# Patient Record
Sex: Male | Born: 1965 | Race: White | Hispanic: No | Marital: Single | Smoking: Never smoker
Health system: Southern US, Community
[De-identification: ages and names within clinical notes are randomized; demographics above are authoritative.]

## PROBLEM LIST (undated history)

## (undated) HISTORY — PX: BACK SURGERY: SHX140

---

## 1999-04-05 ENCOUNTER — Emergency Department (HOSPITAL_COMMUNITY): Admission: EM | Admit: 1999-04-05 | Discharge: 1999-04-05 | Payer: Self-pay | Admitting: Emergency Medicine

## 1999-04-05 ENCOUNTER — Encounter: Payer: Self-pay | Admitting: Emergency Medicine

## 1999-06-01 ENCOUNTER — Emergency Department (HOSPITAL_COMMUNITY): Admission: EM | Admit: 1999-06-01 | Discharge: 1999-06-01 | Payer: Self-pay | Admitting: Emergency Medicine

## 2005-01-17 ENCOUNTER — Emergency Department (HOSPITAL_COMMUNITY): Admission: EM | Admit: 2005-01-17 | Discharge: 2005-01-17 | Payer: Self-pay | Admitting: Emergency Medicine

## 2005-08-09 ENCOUNTER — Emergency Department (HOSPITAL_COMMUNITY): Admission: EM | Admit: 2005-08-09 | Discharge: 2005-08-09 | Payer: Self-pay | Admitting: Emergency Medicine

## 2009-08-10 ENCOUNTER — Emergency Department (HOSPITAL_COMMUNITY): Admission: EM | Admit: 2009-08-10 | Discharge: 2009-08-10 | Payer: Self-pay | Admitting: Emergency Medicine

## 2010-01-03 ENCOUNTER — Emergency Department (HOSPITAL_BASED_OUTPATIENT_CLINIC_OR_DEPARTMENT_OTHER): Admission: EM | Admit: 2010-01-03 | Discharge: 2010-01-04 | Payer: Self-pay | Admitting: Emergency Medicine

## 2015-01-02 ENCOUNTER — Encounter: Payer: Self-pay | Admitting: Internal Medicine

## 2015-01-02 ENCOUNTER — Ambulatory Visit: Payer: Self-pay | Attending: Internal Medicine | Admitting: Internal Medicine

## 2015-01-02 VITALS — BP 144/89 | HR 103 | Temp 98.0°F | Resp 16 | Ht 72.0 in | Wt 250.0 lb

## 2015-01-02 DIAGNOSIS — R030 Elevated blood-pressure reading, without diagnosis of hypertension: Secondary | ICD-10-CM

## 2015-01-02 DIAGNOSIS — I10 Essential (primary) hypertension: Secondary | ICD-10-CM | POA: Insufficient documentation

## 2015-01-02 DIAGNOSIS — M5441 Lumbago with sciatica, right side: Secondary | ICD-10-CM | POA: Insufficient documentation

## 2015-01-02 DIAGNOSIS — IMO0001 Reserved for inherently not codable concepts without codable children: Secondary | ICD-10-CM

## 2015-01-02 MED ORDER — GABAPENTIN 300 MG PO CAPS: 300.0000 mg | ORAL_CAPSULE | Freq: Two times a day (BID) | ORAL | Status: AC

## 2015-01-02 MED ORDER — CYCLOBENZAPRINE HCL 10 MG PO TABS: 10.0000 mg | ORAL_TABLET | Freq: Three times a day (TID) | ORAL | Status: AC | PRN

## 2015-01-02 MED ORDER — ACETAMINOPHEN-CODEINE #3 300-30 MG PO TABS: 1.0000 | ORAL_TABLET | Freq: Three times a day (TID) | ORAL | Status: AC | PRN

## 2015-01-02 NOTE — Patient Instructions (Signed)
Sciatica Sciatica is pain, weakness, numbness, or tingling along the path of the sciatic nerve. The nerve starts in the lower back and runs down the back of each leg. The nerve controls the muscles in the lower leg and in the back of the knee, while also providing sensation to the back of the thigh, lower leg, and the sole of your foot. Sciatica is a symptom of another medical condition. For instance, nerve damage or certain conditions, such as a herniated disk or bone spur on the spine, pinch or put pressure on the sciatic nerve. This causes the pain, weakness, or other sensations normally associated with sciatica. Generally, sciatica only affects one side of the body. CAUSES   Herniated or slipped disc.  Degenerative disk disease.  A pain disorder involving the narrow muscle in the buttocks (piriformis syndrome).  Pelvic injury or fracture.  Pregnancy.  Tumor (rare). SYMPTOMS  Symptoms can vary from mild to very severe. The symptoms usually travel from the low back to the buttocks and down the back of the leg. Symptoms can include:  Mild tingling or dull aches in the lower back, leg, or hip.  Numbness in the back of the calf or sole of the foot.  Burning sensations in the lower back, leg, or hip.  Sharp pains in the lower back, leg, or hip.  Leg weakness.  Severe back pain inhibiting movement. These symptoms may get worse with coughing, sneezing, laughing, or prolonged sitting or standing. Also, being overweight may worsen symptoms. DIAGNOSIS  Your caregiver will perform a physical exam to look for common symptoms of sciatica. He or she may ask you to do certain movements or activities that would trigger sciatic nerve pain. Other tests may be performed to find the cause of the sciatica. These may include:  Blood tests.  X-rays.  Imaging tests, such as an MRI or CT scan. TREATMENT  Treatment is directed at the cause of the sciatic pain. Sometimes, treatment is not necessary  and the pain and discomfort goes away on its own. If treatment is needed, your caregiver may suggest:  Over-the-counter medicines to relieve pain.  Prescription medicines, such as anti-inflammatory medicine, muscle relaxants, or narcotics.  Applying heat or ice to the painful area.  Steroid injections to lessen pain, irritation, and inflammation around the nerve.  Reducing activity during periods of pain.  Exercising and stretching to strengthen your abdomen and improve flexibility of your spine. Your caregiver may suggest losing weight if the extra weight makes the back pain worse.  Physical therapy.  Surgery to eliminate what is pressing or pinching the nerve, such as a bone spur or part of a herniated disk. HOME CARE INSTRUCTIONS   Only take over-the-counter or prescription medicines for pain or discomfort as directed by your caregiver.  Apply ice to the affected area for 20 minutes, 3-4 times a day for the first 48-72 hours. Then try heat in the same way.  Exercise, stretch, or perform your usual activities if these do not aggravate your pain.  Attend physical therapy sessions as directed by your caregiver.  Keep all follow-up appointments as directed by your caregiver.  Do not wear high heels or shoes that do not provide proper support.  Check your mattress to see if it is too soft. A firm mattress may lessen your pain and discomfort. SEEK IMMEDIATE MEDICAL CARE IF:   You lose control of your bowel or bladder (incontinence).  You have increasing weakness in the lower back, pelvis, buttocks,   or legs.  You have redness or swelling of your back.  You have a burning sensation when you urinate.  You have pain that gets worse when you lie down or awakens you at night.  Your pain is worse than you have experienced in the past.  Your pain is lasting longer than 4 weeks.  You are suddenly losing weight without reason. MAKE SURE YOU:  Understand these  instructions.  Will watch your condition.  Will get help right away if you are not doing well or get worse. Document Released: 10/27/2001 Document Revised: 05/03/2012 Document Reviewed: 03/13/2012 [employer] Patient Information 2015 [employer]. This information is not intended to replace advice given to you by your health care provider. Make sure you discuss any questions you have with your health care provider.  

## 2015-01-02 NOTE — Progress Notes (Signed)
Pt is here to establish care. Pt has had 3 surgery's on his lower back. Pt states that when it gets cold his back pain flares up.

## 2015-01-02 NOTE — Progress Notes (Signed)
Patient ID: Mark Craig, male   DOB: 07/25/1966, 49 y.o.   MRN: [id number]  [id number]  [id number]  DOB - 09/16/1966  CC:  Chief Complaint  Patient presents with  . Follow-up       HPI: Mark Moodyodd Evelyn is a 49 y.o. male here today to establish medical care. Patient has no past medical history. He reports that he has had 3 surgeries on his back in the past including a laminectomy and 2 ruptured disc repair since 1987. He reports that has been using ibuprofen and tylenol for pain but states his stomach is bothering him. Orthopedics placed him on Neurotin, muscle relaxants, and pain medication.  The pain is located in his lower back and feels like his sciatic nerve is flaring up causing a tingling sensation in his left leg. He states that it is worse when he gets off work in the evenings and when the weather is cold. He states that he works daily and has no interest in applying for disability. He denies bowel/bladder dysfunction.   Patient has No headache, No chest pain, No abdominal pain - No Nausea, No Cough - SOB.  No Known Allergies History reviewed. No pertinent past medical history. No current outpatient prescriptions on file prior to visit.   No current facility-administered medications on file prior to visit.   History reviewed. No pertinent family history. History   Social History  . Marital Status: Single    Spouse Name: N/A  . Number of Children: N/A  . Years of Education: N/A   Occupational History  . Not on file.   Social History Main Topics  . Smoking status: Never Smoker   . Smokeless tobacco: Not on file  . Alcohol Use: No  . Drug Use: No  . Sexual Activity: Yes   Other Topics Concern  . Not on file   Social History Narrative  . No narrative on file    Review of Systems: See HPI  Objective:   Filed Vitals:   01/02/15 1118  BP: 145/84  Pulse: 103  Temp: 98 F (36.7 C)  Resp: 16    Physical Exam  Constitutional: He is oriented to person,  place, and time.  Cardiovascular: Normal rate, regular rhythm and normal heart sounds.   Pulmonary/Chest: Effort normal and breath sounds normal.  Musculoskeletal: He exhibits no edema or tenderness.  + straight leg raise (left)   Neurological: He is alert and oriented to person, place, and time.  Skin: Skin is warm and dry.  Psychiatric: He has a normal mood and affect.     No results found for: WBC, HGB, HCT, MCV, PLT No results found for: CREATININE, BUN, NA, K, CL, CO2  No results found for: HGBA1C Lipid Panel  No results found for: CHOL, TRIG, HDL, CHOLHDL, VLDL, LDLCALC     Assessment and plan:   Mark Craig was seen today for follow-up.  Diagnoses and all orders for this visit:  Midline low back pain with right-sided sciatica Orders: -     gabapentin (NEURONTIN) 300 MG capsule; Take 1 capsule (300 mg total) by mouth 2 (two) times daily. -     cyclobenzaprine (FLEXERIL) 10 MG tablet; Take 1 tablet (10 mg total) by mouth 3 (three) times daily as needed for muscle spasms. -     acetaminophen-codeine (TYLENOL #3) 300-30 MG per tablet; Take 1 tablet by mouth every 8 (eight) hours as needed for moderate pain. Explained signs and symptoms that should warrant immediate attention.  Patient verbalized  understanding with teach back used.  Elevated BP Will continue to monitor on each exam.   Return for call in 2 weeks for pain management referral, once patient gets his records transferred here from previous Ortho doctor for review. Patient is willing to pay out of pocket for pain management appointments.     Holland Commons, NP-C [facility] [phone] 01/02/2015, 11:47 AM

## 2015-01-16 ENCOUNTER — Telehealth: Payer: Self-pay | Admitting: Internal Medicine

## 2015-01-16 NOTE — Telephone Encounter (Signed)
Patient came into the clinic this afternoon to inquire about a referral that he asked for a few weeks ago; patient was very upset about referral not being placed and asked for a nurse or doctor to give him a call because he just recently had surgery; please f/u with patient about the status of his referral

## 2015-01-17 NOTE — Telephone Encounter (Signed)
Please read the bottom of my last office visit and call patient back. Please explain that him and I spoke about him getting records sent to office first so I may send them off with the pain management referral. I have not received any records. If he is that adamant about the referral you may place but advise him that he may be denied if there is nothing to review for them to figure out what they are treating.

## 2015-01-17 NOTE — Telephone Encounter (Signed)
Give information to pt, pt stated medical record  form was sigh. Advised to call previews provider to send records   Stated will come in to speak with supervisor

## 2015-01-21 ENCOUNTER — Ambulatory Visit: Payer: Self-pay

## 2015-05-10 ENCOUNTER — Emergency Department (HOSPITAL_COMMUNITY)
Admission: EM | Admit: 2015-05-10 | Discharge: 2015-05-10 | Disposition: A | Discharge status: Home / Self Care | Attending: Emergency Medicine | Admitting: Emergency Medicine

## 2015-05-10 ENCOUNTER — Emergency Department (HOSPITAL_COMMUNITY)

## 2015-05-10 ENCOUNTER — Encounter (HOSPITAL_COMMUNITY): Payer: Self-pay | Admitting: Emergency Medicine

## 2015-05-10 DIAGNOSIS — F1123 Opioid dependence with withdrawal: Secondary | ICD-10-CM | POA: Diagnosis not present

## 2015-05-10 DIAGNOSIS — Z79899 Other long term (current) drug therapy: Secondary | ICD-10-CM | POA: Insufficient documentation

## 2015-05-10 DIAGNOSIS — F1193 Opioid use, unspecified with withdrawal: Secondary | ICD-10-CM

## 2015-05-10 DIAGNOSIS — R109 Unspecified abdominal pain: Secondary | ICD-10-CM | POA: Insufficient documentation

## 2015-05-10 DIAGNOSIS — R112 Nausea with vomiting, unspecified: Secondary | ICD-10-CM | POA: Insufficient documentation

## 2015-05-10 DIAGNOSIS — R1110 Vomiting, unspecified: Secondary | ICD-10-CM | POA: Diagnosis present

## 2015-05-10 LAB — CBC WITH DIFFERENTIAL/PLATELET
BASOS ABS: 0 10*3/uL (ref 0.0–0.1)
BASOS PCT: 0 % (ref 0–1)
EOS ABS: 0 10*3/uL (ref 0.0–0.7)
Eosinophils Relative: 0 % (ref 0–5)
HCT: 43.2 % (ref 39.0–52.0)
Hemoglobin: 14.7 g/dL (ref 13.0–17.0)
Lymphocytes Relative: 18 % (ref 12–46)
Lymphs Abs: 2.1 10*3/uL (ref 0.7–4.0)
MCH: 29.9 pg (ref 26.0–34.0)
MCHC: 34 g/dL (ref 30.0–36.0)
MCV: 87.8 fL (ref 78.0–100.0)
MONO ABS: 1 10*3/uL (ref 0.1–1.0)
Monocytes Relative: 8 % (ref 3–12)
NEUTROS ABS: 8.6 10*3/uL — AB (ref 1.7–7.7)
Neutrophils Relative %: 74 % (ref 43–77)
Platelets: 264 10*3/uL (ref 150–400)
RBC: 4.92 MIL/uL (ref 4.22–5.81)
RDW: 12.5 % (ref 11.5–15.5)
WBC: 11.7 10*3/uL — ABNORMAL HIGH (ref 4.0–10.5)

## 2015-05-10 LAB — URINALYSIS, ROUTINE W REFLEX MICROSCOPIC
Bilirubin Urine: NEGATIVE
Glucose, UA: NEGATIVE mg/dL
Hgb urine dipstick: NEGATIVE
KETONES UR: NEGATIVE mg/dL
LEUKOCYTES UA: NEGATIVE
Nitrite: NEGATIVE
PH: 6 (ref 5.0–8.0)
PROTEIN: NEGATIVE mg/dL
SPECIFIC GRAVITY, URINE: 1.02 (ref 1.005–1.030)
Urobilinogen, UA: 0.2 mg/dL (ref 0.0–1.0)

## 2015-05-10 LAB — COMPREHENSIVE METABOLIC PANEL
ALT: 19 U/L (ref 17–63)
AST: 16 U/L (ref 15–41)
Albumin: 3.8 g/dL (ref 3.5–5.0)
Alkaline Phosphatase: 61 U/L (ref 38–126)
Anion gap: 8 (ref 5–15)
BUN: 13 mg/dL (ref 6–20)
CALCIUM: 8.4 mg/dL — AB (ref 8.9–10.3)
CHLORIDE: 106 mmol/L (ref 101–111)
CO2: 24 mmol/L (ref 22–32)
Creatinine, Ser: 1.04 mg/dL (ref 0.61–1.24)
GFR calc Af Amer: >60 mL/min (ref >60)
GFR calc non Af Amer: >60 mL/min (ref >60)
Glucose, Bld: 99 mg/dL (ref 65–99)
Potassium: 4 mmol/L (ref 3.5–5.1)
SODIUM: 138 mmol/L (ref 135–145)
Total Bilirubin: 1.3 mg/dL — ABNORMAL HIGH (ref 0.3–1.2)
Total Protein: 6.9 g/dL (ref 6.5–8.1)

## 2015-05-10 MED ORDER — SODIUM CHLORIDE 0.9 % IV BOLUS (SEPSIS)
1000.0000 mL | Freq: Once | INTRAVENOUS | Status: AC
  Administered 2015-05-10: 1000 mL via INTRAVENOUS

## 2015-05-10 MED ORDER — ONDANSETRON HCL 4 MG/2ML IJ SOLN
4.0000 mg | Freq: Once | INTRAMUSCULAR | Status: AC
  Administered 2015-05-10: 4 mg via INTRAVENOUS
  Filled 2015-05-10: qty 2

## 2015-05-10 MED ORDER — GI COCKTAIL ~~LOC~~
ORAL | Status: AC
  Filled 2015-05-10: qty 30

## 2015-05-10 MED ORDER — KETOROLAC TROMETHAMINE 30 MG/ML IJ SOLN
30.0000 mg | Freq: Once | INTRAMUSCULAR | Status: AC
  Administered 2015-05-10: 30 mg via INTRAVENOUS
  Filled 2015-05-10: qty 1

## 2015-05-10 MED ORDER — GI COCKTAIL ~~LOC~~
30.0000 mL | Freq: Once | ORAL | Status: AC
  Administered 2015-05-10: 30 mL via ORAL

## 2015-05-10 MED ORDER — ONDANSETRON 8 MG PO TBDP: 8.0000 mg | ORAL_TABLET | Freq: Three times a day (TID) | ORAL | Status: AC | PRN

## 2015-05-10 NOTE — ED Provider Notes (Signed)
CSN: [id number]     Arrival date & time 05/10/15  0354 History   First MD Initiated Contact with Patient 05/10/15 0358     Chief Complaint  Patient presents with  . Emesis      HPI Patient with nausea and vomiting over the past 48 hours.  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  He's been incarcerated for the past 4 days.  His had no Roxicodone since his incarceration.  He reports left-sided flank and left-sided abdominal pain with associated nausea vomiting.  Reports urinary frequency without dysuria.  No prior history kidney stones.  No fevers or chills.  No hematemesis.  His pain is moderate in severity.   History reviewed. No pertinent past medical history. Past Surgical History  Procedure Laterality Date  . Back surgery     History reviewed. No pertinent family history. History  Substance Use Topics  . Smoking status: Never Smoker   . Smokeless tobacco: Not on file  . Alcohol Use: No    Review of Systems  All other systems reviewed and are negative.     Allergies  Review of patient's allergies indicates no known allergies.  Home Medications   Prior to Admission medications   Medication Sig Start Date End Date Taking? Authorizing Provider  acetaminophen-codeine (TYLENOL #3) 300-30 MG per tablet Take 1 tablet by mouth every 8 (eight) hours as needed for moderate pain. 01/02/15  Yes Ambrose Finland, NP  cyclobenzaprine (FLEXERIL) 10 MG tablet Take 1 tablet (10 mg total) by mouth 3 (three) times daily as needed for muscle spasms. 01/02/15  Yes Ambrose Finland, NP  gabapentin (NEURONTIN) 300 MG capsule Take 1 capsule (300 mg total) by mouth 2 (two) times daily. 01/02/15  Yes Ambrose Finland, NP  ibuprofen (ADVIL,MOTRIN) 800 MG tablet Take 800 mg by mouth every 8 (eight) hours as needed.    Historical Provider, MD  ondansetron (ZOFRAN ODT) 8 MG disintegrating tablet Take 1 tablet (8 mg total) by mouth every 8 (eight) hours as needed for nausea or vomiting. 05/10/15   Azalia Bilis, MD   BP 136/85 mmHg  Pulse 78  Temp(Src) 98.1 F (36.7 C) (Oral)  Resp 20  Ht 6' (1.829 m)  Wt 220 lb (99.791 kg)  BMI 29.83 kg/m2  SpO2 97% Physical Exam  Constitutional: He is oriented to person, place, and time. He appears well-developed and well-nourished.  HENT:  Head: Normocephalic and atraumatic.  Eyes: EOM are normal.  Neck: Normal range of motion.  Cardiovascular: Normal rate, regular rhythm, normal heart sounds and intact distal pulses.   Pulmonary/Chest: Effort normal and breath sounds normal. No respiratory distress.  Abdominal: Soft. He exhibits no distension. There is no tenderness.  Musculoskeletal: Normal range of motion.  Neurological: He is alert and oriented to person, place, and time.  Skin: Skin is warm and dry.  Psychiatric: He has a normal mood and affect. Judgment normal.  Nursing note and vitals reviewed.   ED Course  Procedures (including critical care time) Labs Review Labs Reviewed  CBC WITH DIFFERENTIAL/PLATELET - Abnormal; Notable for the following:    WBC 11.7 (*)    Neutro Abs 8.6 (*)    All other components within normal limits  COMPREHENSIVE METABOLIC PANEL - Abnormal; Notable for the following:    Calcium 8.4 (*)    Total Bilirubin 1.3 (*)    All other components within normal limits  URINALYSIS, ROUTINE W REFLEX MICROSCOPIC (NOT AT [facility])    Imaging  Review Ct Renal Stone Study  05/10/2015   CLINICAL DATA:  Acute onset of generalized abdominal pain, nausea and vomiting. Left flank pain. Initial encounter.  EXAM: CT ABDOMEN AND PELVIS WITHOUT CONTRAST  TECHNIQUE: Multidetector CT imaging of the abdomen and pelvis was performed following the standard protocol without IV contrast.  COMPARISON:  None.  FINDINGS: The visualized lung bases are clear.  The liver and spleen are unremarkable in appearance. The gallbladder is within normal limits. The pancreas and adrenal glands are unremarkable.  Mild nonspecific perinephric stranding is  noted bilaterally. The kidneys are otherwise unremarkable. There is no evidence of hydronephrosis. No renal or ureteral stones are seen.  No free fluid is identified. The small bowel is unremarkable in appearance. The stomach is within normal limits. No acute vascular abnormalities are seen. Minimal calcification is seen along the abdominal aorta.  The appendix is normal in caliber, without evidence of appendicitis. The colon is unremarkable in appearance.  The bladder is mildly distended and grossly unremarkable. The prostate remains normal in size. No inguinal lymphadenopathy is seen.  Small bilateral inguinal hernias are seen, containing only fat.  No acute osseous abnormalities are identified. Vacuum phenomenon, disc space narrowing and mild endplate sclerosis are seen L4-L5.  IMPRESSION: 1. No acute abnormality seen within the abdomen or pelvis. 2. Small bilateral inguinal hernias, containing only fat.   Electronically Signed   By: Roanna Raider M.D.   On: 05/10/2015 04:42  I personally reviewed the imaging tests through PACS system I reviewed available ER/hospitalization records through the EMR    EKG Interpretation None      MDM   Final diagnoses:  Abdominal pain, unspecified abdominal location  Opioid withdrawal    Hydration emergency department.  Labs, urine, CT scan without acute abnormality.  Discharge home in good condition.  Home with Zofran.  I suspect the majority of this is a opioid withdrawal.  Patient is able to keep down fluids in the ER    Azalia Bilis, MD 05/10/15 [id number]

## 2015-05-10 NOTE — Discharge Instructions (Signed)
Abdominal Pain Many things can cause abdominal pain. Usually, abdominal pain is not caused by a disease and will improve without treatment. It can often be observed and treated at home. Your health care provider will do a physical exam and possibly order blood tests and X-rays to help determine the seriousness of your pain. However, in many cases, more time must pass before a clear cause of the pain can be found. Before that point, your health care provider may not know if you need more testing or further treatment. HOME CARE INSTRUCTIONS  Monitor your abdominal pain for any changes. The following actions may help to alleviate any discomfort you are experiencing:  Only take over-the-counter or prescription medicines as directed by your health care provider.  Do not take laxatives unless directed to do so by your health care provider.  Try a clear liquid diet (broth, tea, or water) as directed by your health care provider. Slowly move to a bland diet as tolerated. SEEK MEDICAL CARE IF:  You have unexplained abdominal pain.  You have abdominal pain associated with nausea or diarrhea.  You have pain when you urinate or have a bowel movement.  You experience abdominal pain that wakes you in the night.  You have abdominal pain that is worsened or improved by eating food.  You have abdominal pain that is worsened with eating fatty foods.  You have a fever. SEEK IMMEDIATE MEDICAL CARE IF:   Your pain does not go away within 2 hours.  You keep throwing up (vomiting).  Your pain is felt only in portions of the abdomen, such as the right side or the left lower portion of the abdomen.  You pass bloody or black tarry stools. MAKE SURE YOU:  Understand these instructions.   Will watch your condition.   Will get help right away if you are not doing well or get worse.  Document Released: 08/12/2005 Document Revised: 11/07/2013 Document Reviewed: 07/12/2013 [facility] Patient Information  2015 [place], [place]. This information is not intended to replace advice given to you by your health care provider. Make sure you discuss any questions you have with your health care provider.  Opioid Withdrawal Opioids are a group of narcotic drugs. They include the street drug heroin. They also include pain medicines, such as morphine, hydrocodone, oxycodone, and fentanyl. Opioid withdrawal is a group of characteristic physical and mental signs and symptoms. It typically occurs if you have been using opioids daily for several weeks or longer and stop using or rapidly decrease use. Opioid withdrawal can also occur if you have used opioids daily for a long time and are given a medicine to block the effect.  SIGNS AND SYMPTOMS Opioid withdrawal includes three or more of the following symptoms:   Depressed, anxious, or irritable mood.  Nausea or vomiting.  Muscle aches or spasms.   Watery eyes.   Runny nose.  Dilated pupils, sweating, or hairs standing on end.  Diarrhea or intestinal cramping.  Yawning.   Fever.  Increased blood pressure.  Fast pulse.  Restlessness or trouble sleeping. These signs and symptoms occur within several hours of stopping or reducing short-acting opioids, such as heroin. They can occur within 3 days of stopping or reducing long-acting opioids, such as methadone. Withdrawal begins within minutes of receiving a drug that blocks the effects of opioids, such as naltrexone or naloxone. DIAGNOSIS  Opioid use disorder is diagnosed by your health care provider. You will be asked about your symptoms, drug and alcohol use,  medical history, and use of medicines. A physical exam may be done. Lab tests may be ordered. Your health care provider may have you see a mental health professional.  TREATMENT  The treatment for opioid withdrawal is usually provided by medical doctors with special training in substance use disorders (addiction specialists). The following  medicines may be included in treatment:  Opioids given in place of the abused opioid. They turn on opioid receptors in the brain and lessen or prevent withdrawal symptoms. They are gradually decreased (opioid substitution and taper).  Non-opioids that can lessen certain opioid withdrawal symptoms. They may be used alone or with opioid substitution and taper. Successful long-term recovery usually requires medicine, counseling, and group support. HOME CARE INSTRUCTIONS   Take medicines only as directed by your health care provider.  Check with your health care provider before starting new medicines.  Keep all follow-up visits as directed by your health care provider. SEEK MEDICAL CARE IF:  You are not able to take your medicines as directed.  Your symptoms get worse.  You relapse. SEEK IMMEDIATE MEDICAL CARE IF:  You have serious thoughts about hurting yourself or others.  You have a seizure.  You lose consciousness. Document Released: 11/05/2003 Document Revised: 03/19/2014 Document Reviewed: 11/15/2013 [facility] Patient Information 2015 [place], [place]. This information is not intended to replace advice given to you by your health care provider. Make sure you discuss any questions you have with your health care provider.

## 2015-05-10 NOTE — ED Notes (Signed)
Pt having some abd pain with n/v. Pt states he is coming off roxicodone. Pt states he has not had any pain meds since Sunday. Pt also c/o left flank pain.

## 2015-05-10 NOTE — ED Notes (Signed)
Pt requesting something to eat states has not eaten since monday

## 2017-01-12 IMAGING — CT CT RENAL STONE PROTOCOL
2 of 4 series · 16 of 46 positions shown, 18 images · non-contrast
Comparison: None.

CLINICAL DATA: Acute onset of generalized abdominal pain, nausea
and vomiting. Left flank pain. Initial encounter.

EXAM:
CT ABDOMEN AND PELVIS WITHOUT CONTRAST
TECHNIQUE: Multidetector CT imaging of the abdomen and pelvis was performed
following the standard protocol without IV contrast.

[Series 2: standard/full over (age)lbs 5.0 · axial · 0.86mm/px · z∈[-526,-76]mm · 13 of 100 slices shown, 15 images]
[im 5/100  soft-tissue]
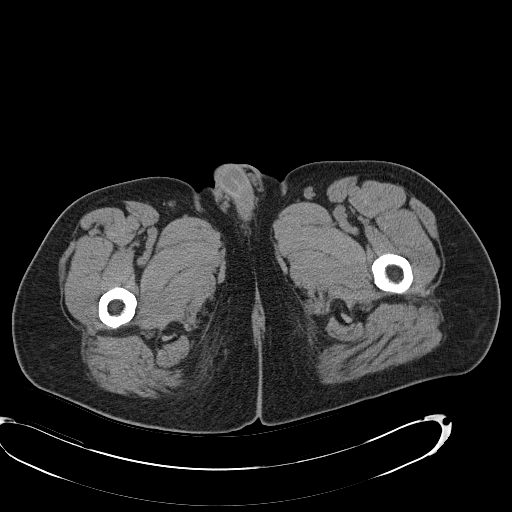
[im 5/100  bone]
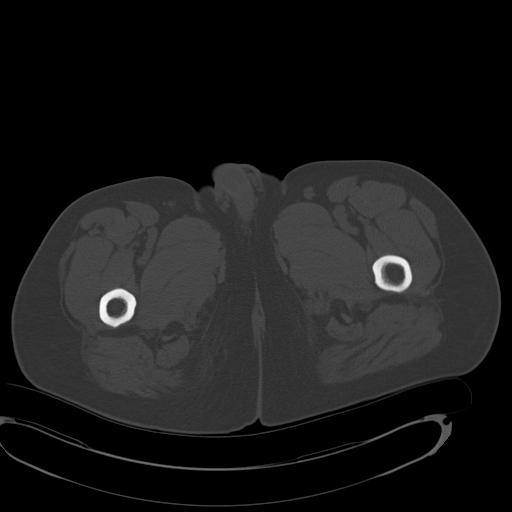
[im 13/100  soft-tissue]
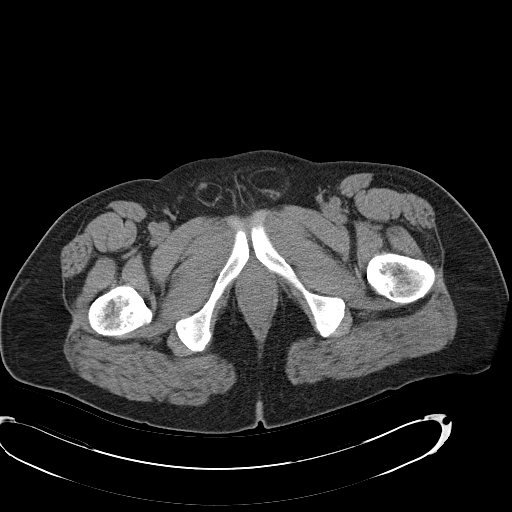
[im 21/100  soft-tissue]
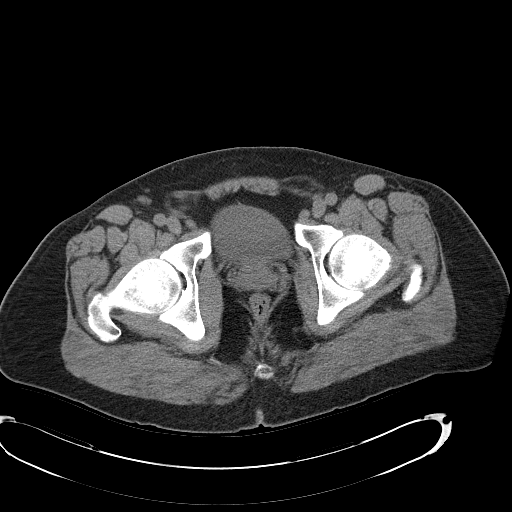
[im 29/100  soft-tissue]
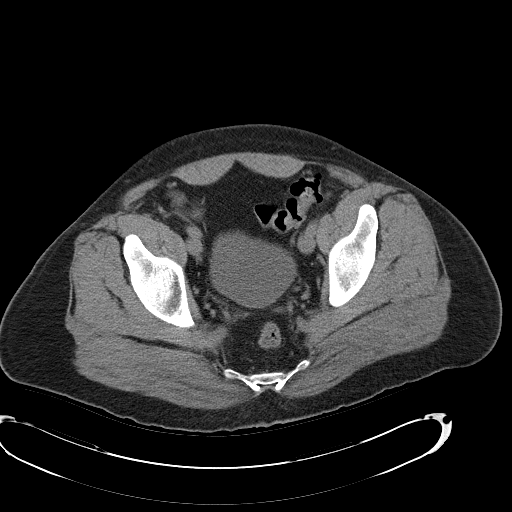
[im 34/100  soft-tissue]
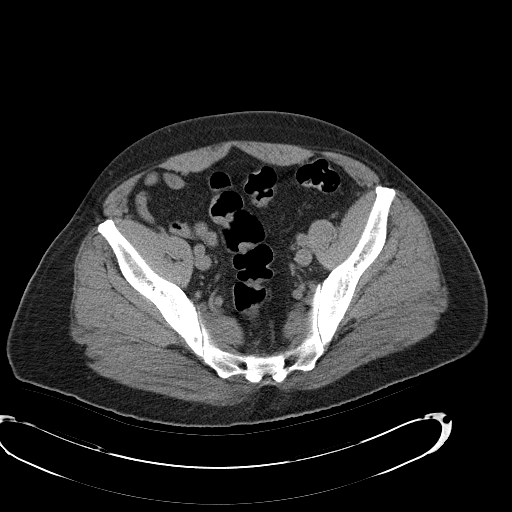
[im 42/100  soft-tissue]
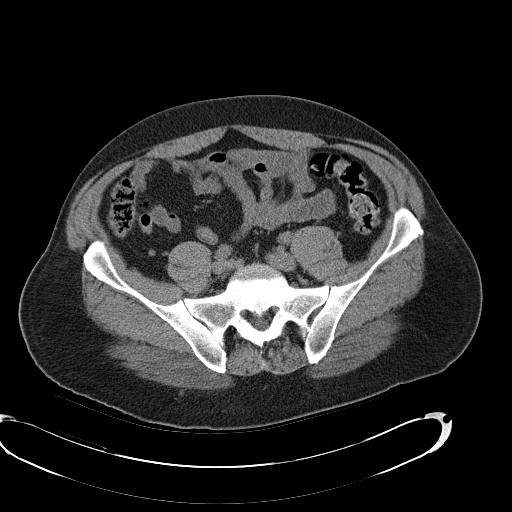
[im 50/100  soft-tissue]
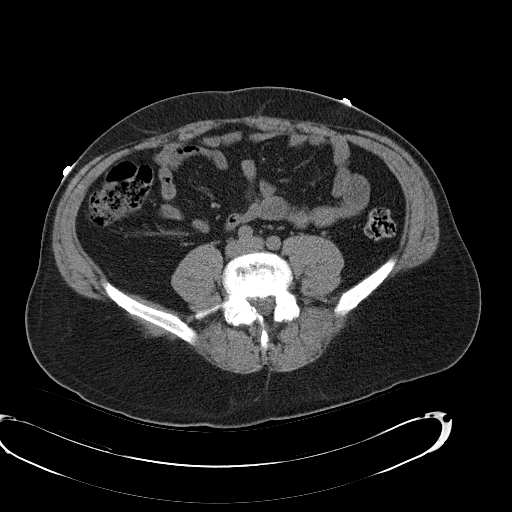
[im 58/100  soft-tissue]
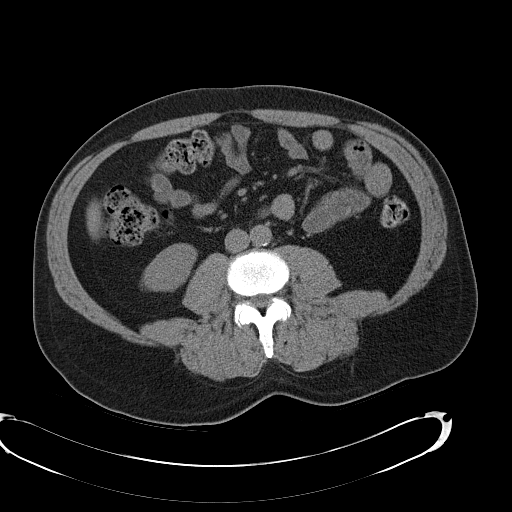
[im 67/100  soft-tissue]
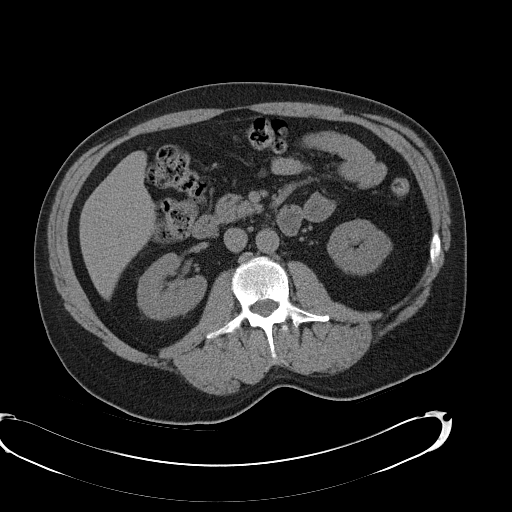
[im 67/100  bone]
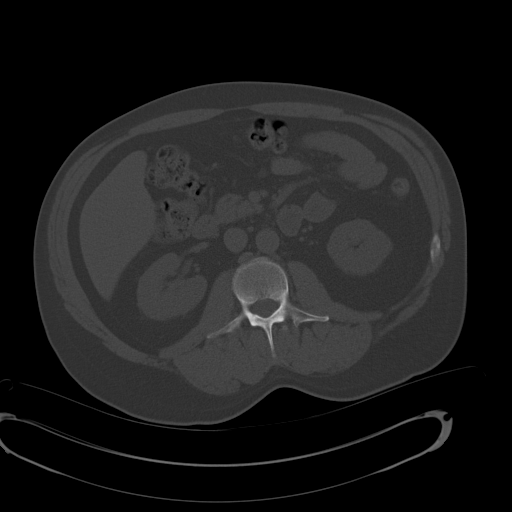
[im 71/100  soft-tissue]
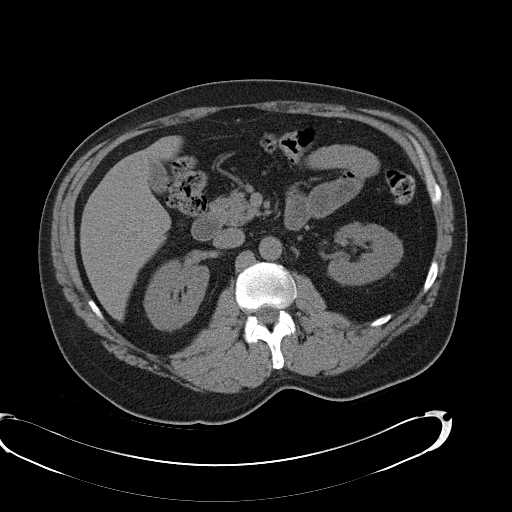
[im 79/100  soft-tissue]
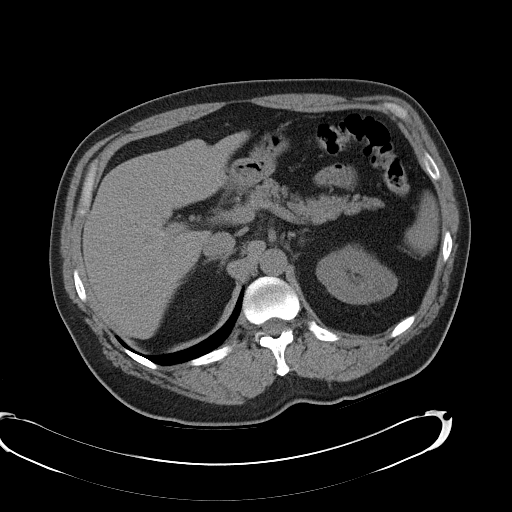
[im 87/100  soft-tissue]
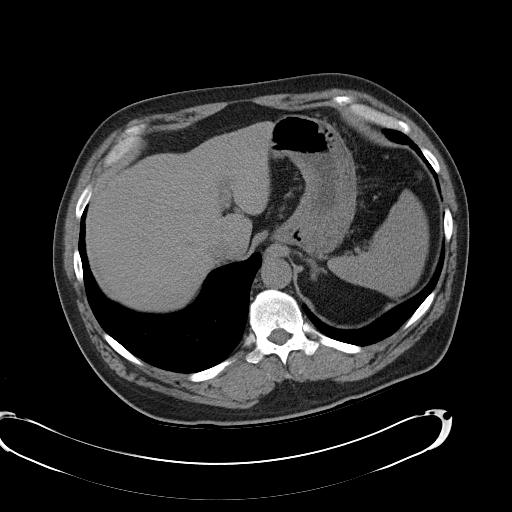
[im 95/100  soft-tissue]
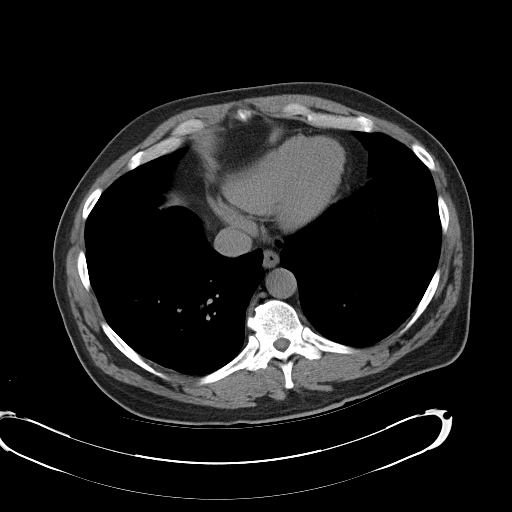

[Series 4: mpr coronal · coronal · 0.81mm/px · 3 of 94 slices shown]
[im 32/94  soft-tissue]
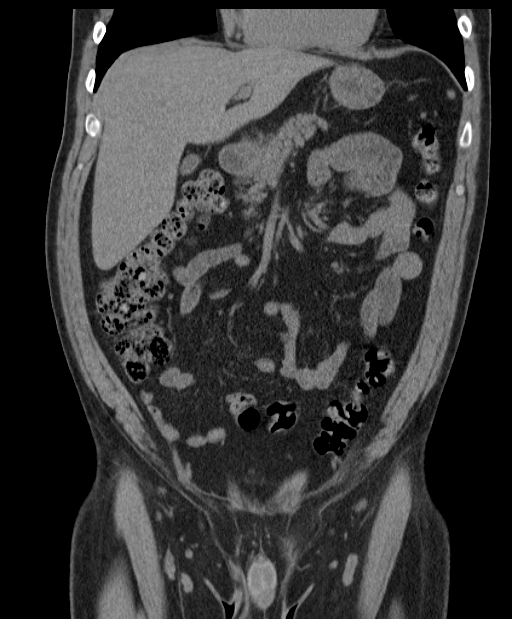
[im 42/94  soft-tissue]
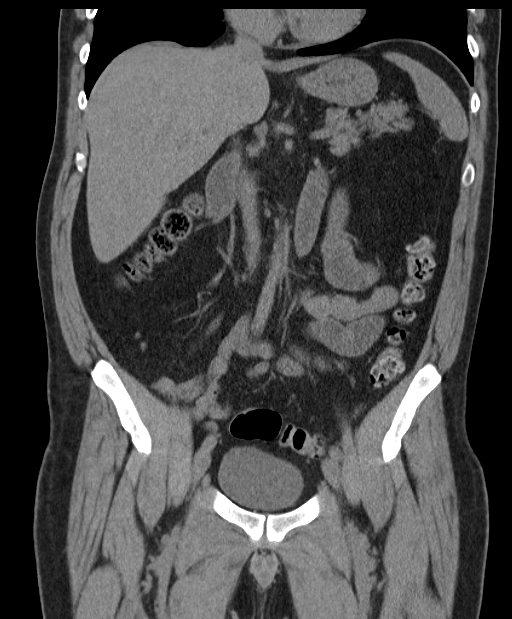
[im 52/94  soft-tissue]
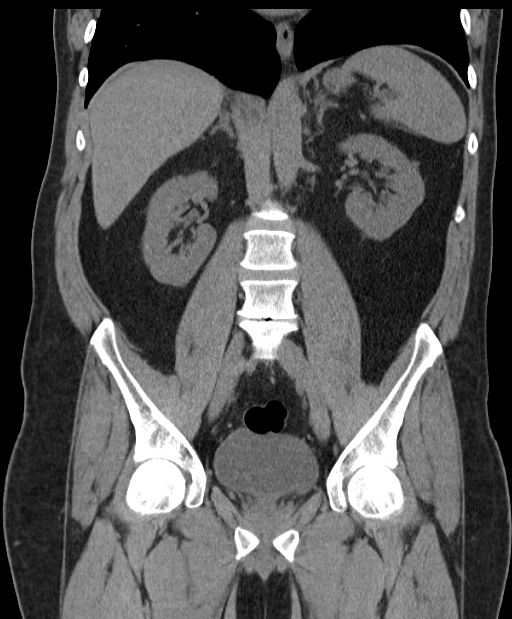

[16 of 46 positions shown; findings below may reference images not displayed]

FINDINGS: The visualized lung bases are clear.

The liver and spleen are unremarkable in appearance. The gallbladder
is within normal limits. The pancreas and adrenal glands are
unremarkable.

Mild nonspecific perinephric stranding is noted bilaterally. The
kidneys are otherwise unremarkable. There is no evidence of
hydronephrosis. No renal or ureteral stones are seen.

No free fluid is identified. The small bowel is unremarkable in
appearance. The stomach is within normal limits. No acute vascular
abnormalities are seen. Minimal calcification is seen along the
abdominal aorta.

The appendix is normal in caliber, without evidence of appendicitis.
The colon is unremarkable in appearance.

The bladder is mildly distended and grossly unremarkable. The
prostate remains normal in size. No inguinal lymphadenopathy is
seen.

Small bilateral inguinal hernias are seen, containing only fat.

No acute osseous abnormalities are identified. Vacuum phenomenon,
disc space narrowing and mild endplate sclerosis are seen L4-L5.
IMPRESSION: 1. No acute abnormality seen within the abdomen or pelvis.
2. Small bilateral inguinal hernias, containing only fat.

## 2019-09-27 ENCOUNTER — Emergency Department (HOSPITAL_COMMUNITY)
Admission: EM | Admit: 2019-09-27 | Discharge: 2019-09-27 | Disposition: A | Discharge status: Home / Self Care | Attending: Emergency Medicine | Admitting: Emergency Medicine

## 2019-09-27 DIAGNOSIS — T40601A Poisoning by unspecified narcotics, accidental (unintentional), initial encounter: Secondary | ICD-10-CM

## 2019-09-27 DIAGNOSIS — Z79899 Other long term (current) drug therapy: Secondary | ICD-10-CM | POA: Insufficient documentation

## 2019-09-27 DIAGNOSIS — T401X1A Poisoning by heroin, accidental (unintentional), initial encounter: Secondary | ICD-10-CM | POA: Insufficient documentation

## 2019-09-27 NOTE — Discharge Instructions (Addendum)
Avoid using illegal drugs.  

## 2019-09-27 NOTE — ED Provider Notes (Signed)
[facility] DEPT Provider Note   CSN: [id number] Arrival date & time: 09/27/19  1301     History   Chief Complaint No chief complaint on file.   HPI Kenai Fluegel is a 53 y.o. male.     Patient is a 53 year old male with a history significant for polysubstance abuse presenting today by EMS after heroin overdose.  Patient states he was not trying to hurt himself and he snorted heroin around 11:00 today.  Patient was found unresponsive and had return of consciousness after receiving Narcan.  Patient states he typically smokes crack in the mornings and uses heroin in the evenings but did use earlier today than usual.  He currently has no complaints except feeling mildly sleepy.  He takes no medications regularly and has no other complaints  The history is provided by the patient.    No past medical history on file.  Patient Active Problem List   Diagnosis Date Noted  . Midline low back pain with right-sided sciatica 01/02/2015    Past Surgical History:  Procedure Laterality Date  . BACK SURGERY          Home Medications    Prior to Admission medications   Medication Sig Start Date End Date Taking? Authorizing Provider  acetaminophen-codeine (TYLENOL #3) 300-30 MG per tablet Take 1 tablet by mouth every 8 (eight) hours as needed for moderate pain. 01/02/15   Lance Bosch, NP  cyclobenzaprine (FLEXERIL) 10 MG tablet Take 1 tablet (10 mg total) by mouth 3 (three) times daily as needed for muscle spasms. 01/02/15   Lance Bosch, NP  gabapentin (NEURONTIN) 300 MG capsule Take 1 capsule (300 mg total) by mouth 2 (two) times daily. 01/02/15   Lance Bosch, NP  ibuprofen (ADVIL,MOTRIN) 800 MG tablet Take 800 mg by mouth every 8 (eight) hours as needed.    [provider]  ondansetron (ZOFRAN ODT) 8 MG disintegrating tablet Take 1 tablet (8 mg total) by mouth every 8 (eight) hours as needed for nausea or vomiting. 05/10/15   Jola Schmidt,  MD    Family History No family history on file.  Social History Social History   Tobacco Use  . Smoking status: Never Smoker  Substance Use Topics  . Alcohol use: No    Alcohol/week: 0.0 standard drinks  . Drug use: No     Allergies   Patient has no known allergies.   Review of Systems Review of Systems  All other systems reviewed and are negative.    Physical Exam Updated Vital Signs BP 138/89 (BP Location: Right Arm)   Pulse 80   Temp 98 F (36.7 C) (Oral)   Resp 14   Ht 6' (1.829 m)   Wt 99.8 kg   SpO2 97%   BMI 29.84 kg/m   Physical Exam Vitals signs and nursing note reviewed.  Constitutional:      General: He is not in acute distress.    Appearance: He is well-developed and normal weight.     Comments: Patient is sleepy but awakens easily to voice  HENT:     Head: Normocephalic and atraumatic.  Eyes:     Conjunctiva/sclera: Conjunctivae normal.     Comments: Pupils are reactive bilaterally but approximately 2 mm bilaterally  Neck:     Musculoskeletal: Normal range of motion and neck supple.  Cardiovascular:     Rate and Rhythm: Normal rate and regular rhythm.     Heart sounds: No murmur.  Pulmonary:     Effort: Pulmonary effort is normal. No respiratory distress.     Breath sounds: Normal breath sounds. No wheezing or rales.  Abdominal:     General: There is no distension.     Palpations: Abdomen is soft.     Tenderness: There is no abdominal tenderness. There is no guarding or rebound.  Musculoskeletal: Normal range of motion.        General: No tenderness.     Comments: No track marks seen.  No extremity injury  Skin:    General: Skin is warm and dry.     Findings: No erythema or rash.  Neurological:     General: No focal deficit present.     Mental Status: He is oriented to person, place, and time. Mental status is at baseline.  Psychiatric:        Behavior: Behavior normal.     Comments: Patient is sleepy but easily awake able and  requesting something to drink      ED Treatments / Results  Labs (all labs ordered are listed, but only abnormal results are displayed) Labs Reviewed - No data to display  EKG None  Radiology No results found.  Procedures Procedures (including critical care time)  Medications Ordered in ED Medications - No data to display   Initial Impression / Assessment and Plan / ED Course  I have reviewed the triage vital signs and the nursing notes.  Pertinent labs & imaging results that were available during my care of the patient were reviewed by me and considered in my medical decision making (see chart for details).        Patient presenting here after an accidental heroin overdose requiring Narcan.  Patient is still mildly sleepy but does not appear to need further Narcan at this time.  We will continue to monitor until patient is back to baseline to ensure no additional toxicity with fentanyl which has a longer half-life.  Final Clinical Impressions(s) / ED Diagnoses   Final diagnoses:  None    ED Discharge Orders    None       Gwyneth Sprout, MD 09/28/19 [phone]

## 2019-09-27 NOTE — ED Notes (Signed)
Pt ambulated to restroom. 

## 2019-09-27 NOTE — ED Notes (Signed)
Pt waiting on social work

## 2019-09-27 NOTE — ED Notes (Signed)
Pt refused discharge vital signs

## 2019-09-27 NOTE — ED Triage Notes (Signed)
Per EMS: Pt found at hotel when [employer] was called for pt trespassing.  Pt was standing at sink brushing his teeth his 5 minutes.  When EMS arrived pt only oriented to self.  Pt RR dropped to 6.  Pt given 0.5 narcan. Pt now A&Ox4.  Pt snorted heroin at 6 pm last night and 10:30 this am.  IV 18 gauge L AC BP 13/86 HR 90 CBG 113 96% 2L [place].

## 2019-09-27 NOTE — ED Notes (Signed)
Pt stated he had a ride coming and walked out.
